# Patient Record
Sex: Male | Born: 1980 | Hispanic: Yes | Marital: Married | Smoking: Never smoker
Health system: Southern US, Community
[De-identification: ages and names within clinical notes are randomized; demographics above are authoritative.]

---

## 2017-03-12 ENCOUNTER — Encounter (HOSPITAL_BASED_OUTPATIENT_CLINIC_OR_DEPARTMENT_OTHER): Payer: Self-pay | Admitting: Emergency Medicine

## 2017-03-12 ENCOUNTER — Emergency Department (HOSPITAL_BASED_OUTPATIENT_CLINIC_OR_DEPARTMENT_OTHER): Payer: Self-pay

## 2017-03-12 ENCOUNTER — Emergency Department (HOSPITAL_BASED_OUTPATIENT_CLINIC_OR_DEPARTMENT_OTHER)
Admission: EM | Admit: 2017-03-12 | Discharge: 2017-03-12 | Disposition: A | Discharge status: Home / Self Care | Payer: Self-pay | Attending: Emergency Medicine | Admitting: Emergency Medicine

## 2017-03-12 DIAGNOSIS — Y929 Unspecified place or not applicable: Secondary | ICD-10-CM | POA: Insufficient documentation

## 2017-03-12 DIAGNOSIS — S93491A Sprain of other ligament of right ankle, initial encounter: Secondary | ICD-10-CM

## 2017-03-12 DIAGNOSIS — Y999 Unspecified external cause status: Secondary | ICD-10-CM | POA: Insufficient documentation

## 2017-03-12 DIAGNOSIS — Y9301 Activity, walking, marching and hiking: Secondary | ICD-10-CM | POA: Insufficient documentation

## 2017-03-12 DIAGNOSIS — S93431A Sprain of tibiofibular ligament of right ankle, initial encounter: Secondary | ICD-10-CM | POA: Insufficient documentation

## 2017-03-12 DIAGNOSIS — W0110XA Fall on same level from slipping, tripping and stumbling with subsequent striking against unspecified object, initial encounter: Secondary | ICD-10-CM | POA: Insufficient documentation

## 2017-03-12 MED ORDER — ACETAMINOPHEN 500 MG PO TABS
1000.0000 mg | ORAL_TABLET | Freq: Once | ORAL | Status: AC
  Administered 2017-03-12: 1000 mg via ORAL
  Filled 2017-03-12: qty 2

## 2017-03-12 NOTE — ED Provider Notes (Signed)
[facility] Provider Note   CSN: [id number] Arrival date & time: 03/12/17  1037     History   Chief Complaint Chief Complaint  Patient presents with  . Ankle Pain    HPI Randy Costa is a 36 y.o. male.  HPI  [age] year old male with no pertinent past medical history presents the ED with constant right ankle pain following mechanical fall 1-2 hours prior to arrival. Patient reports that he slipped on a wet step, twisting his ankle on the stairs. Patient reports being able to catch himself to prevent from additional injury. Denies any other injuries including head, chest, abdomen, back. Pain is exacerbated with palpation and range of motion of the ankle, as well as ambulation. Pain is improved with immobility. No other alleviating or aggravating factors. Patient denies any other physical complaints.   History reviewed. No pertinent past medical history.  There are no active problems to display for this patient.   History reviewed. No pertinent surgical history.     Home Medications    Prior to Admission medications   Not on File    Family History History reviewed. No pertinent family history.  Social History Social History  Substance Use Topics  . Smoking status: Never Smoker  . Smokeless tobacco: Never Used  . Alcohol use No     Allergies   Patient has no known allergies.   Review of Systems Review of Systems  Musculoskeletal: Positive for gait problem and joint swelling.     Physical Exam Updated Vital Signs BP 125/68 (BP Location: Right Arm)   Pulse 72   Resp 18   Ht 5\' 3"  (1.6 m)   SpO2 100%   Physical Exam  Constitutional: He is oriented to person, place, and time. He appears well-developed and well-nourished. No distress.  HENT:  Head: Normocephalic and atraumatic.  Right Ear: External ear normal.  Left Ear: External ear normal.  Nose: Nose normal.  Mouth/Throat: Mucous membranes are normal. No trismus in the jaw.  Eyes:  Conjunctivae and EOM are normal. No scleral icterus.  Neck: Normal range of motion and phonation normal.  Cardiovascular: Normal rate and regular rhythm.   Pulmonary/Chest: Effort normal. No stridor. No respiratory distress.  Abdominal: He exhibits no distension.  Musculoskeletal: Normal range of motion. He exhibits no edema.       Right ankle: He exhibits swelling. He exhibits normal range of motion, no ecchymosis, no deformity and normal pulse. Tenderness. Medial malleolus, AITFL and posterior TFL tenderness found. No head of 5th metatarsal and no proximal fibula tenderness found.  Neurological: He is alert and oriented to person, place, and time.  Skin: He is not diaphoretic.  Psychiatric: He has a normal mood and affect. His behavior is normal.  Vitals reviewed.    ED Treatments / Results  Labs (all labs ordered are listed, but only abnormal results are displayed) Labs Reviewed - No data to display  EKG  EKG Interpretation None       Radiology Dg Ankle Complete Right  Result Date: 03/12/2017 CLINICAL DATA:  Lateral foot and ankle pain after fall. EXAM: RIGHT ANKLE - COMPLETE 3+ VIEW COMPARISON:  None. FINDINGS: No acute fracture or malalignment. The talar dome is intact. The ankle mortise is symmetric. Joint spaces are preserved. Small tibiotalar joint effusion. Mild soft tissue swelling about the ankle. IMPRESSION: Mild soft tissue swelling about the ankle and small tibiotalar joint effusion. No fracture. Electronically Signed   By: Obie DredgeWilliam T Derry M.D.   On:  03/12/2017 11:05   Dg Foot Complete Right  Result Date: 03/12/2017 CLINICAL DATA:  Lateral foot and ankle pain after fall. EXAM: RIGHT FOOT COMPLETE - 3+ VIEW COMPARISON:  None. FINDINGS: There is no evidence of fracture or dislocation. There is no evidence of arthropathy or other focal bone abnormality. Soft tissues are unremarkable. Small tibiotalar joint effusion. IMPRESSION: Small tibiotalar joint effusion.  No  fracture. Electronically Signed   By: Obie DredgeWilliam T Derry M.D.   On: 03/12/2017 11:06    Procedures Procedures (including critical care time)  Medications Ordered in ED Medications  acetaminophen (TYLENOL) tablet 1,000 mg (not administered)     Initial Impression / Assessment and Plan / ED Course  I have reviewed the triage vital signs and the nursing notes.  Pertinent labs & imaging results that were available during my care of the patient were reviewed by me and considered in my medical decision making (see chart for details).     Plain film without acute fractures. Likely low-grade sprain given the lack of instability on exam. Provided with an air cast. Recommended RICE and symptomatic treatment.  The patient is safe for discharge with strict return precautions.   Final Clinical Impressions(s) / ED Diagnoses   Final diagnoses:  Sprain of anterior talofibular ligament of right ankle, initial encounter   Disposition: Discharge  Condition: Good  I have discussed the results, Dx and Tx plan with the patient who expressed understanding and agree(s) with the plan. Discharge instructions discussed at great length. The patient was given strict return precautions who verbalized understanding of the instructions. No further questions at time of discharge.    New Prescriptions   No medications on file    Follow Up: Primary care provider         Nira Connardama, Lowen Mansouri Eduardo, MD 03/12/17 1153

## 2017-03-12 NOTE — ED Notes (Signed)
Patient returned from X-ray 

## 2017-03-12 NOTE — ED Triage Notes (Signed)
Patient fell down some stairs  - patient has pain to his right ankle and foot

## 2018-05-24 IMAGING — CR DG ANKLE COMPLETE 3+V*R*
3 series · 3 of 3 positions shown · non-contrast
Comparison: None.

CLINICAL DATA: Lateral foot and ankle pain after fall.

EXAM:
RIGHT ANKLE - COMPLETE 3+ VIEW

[t ankle joint ap right]
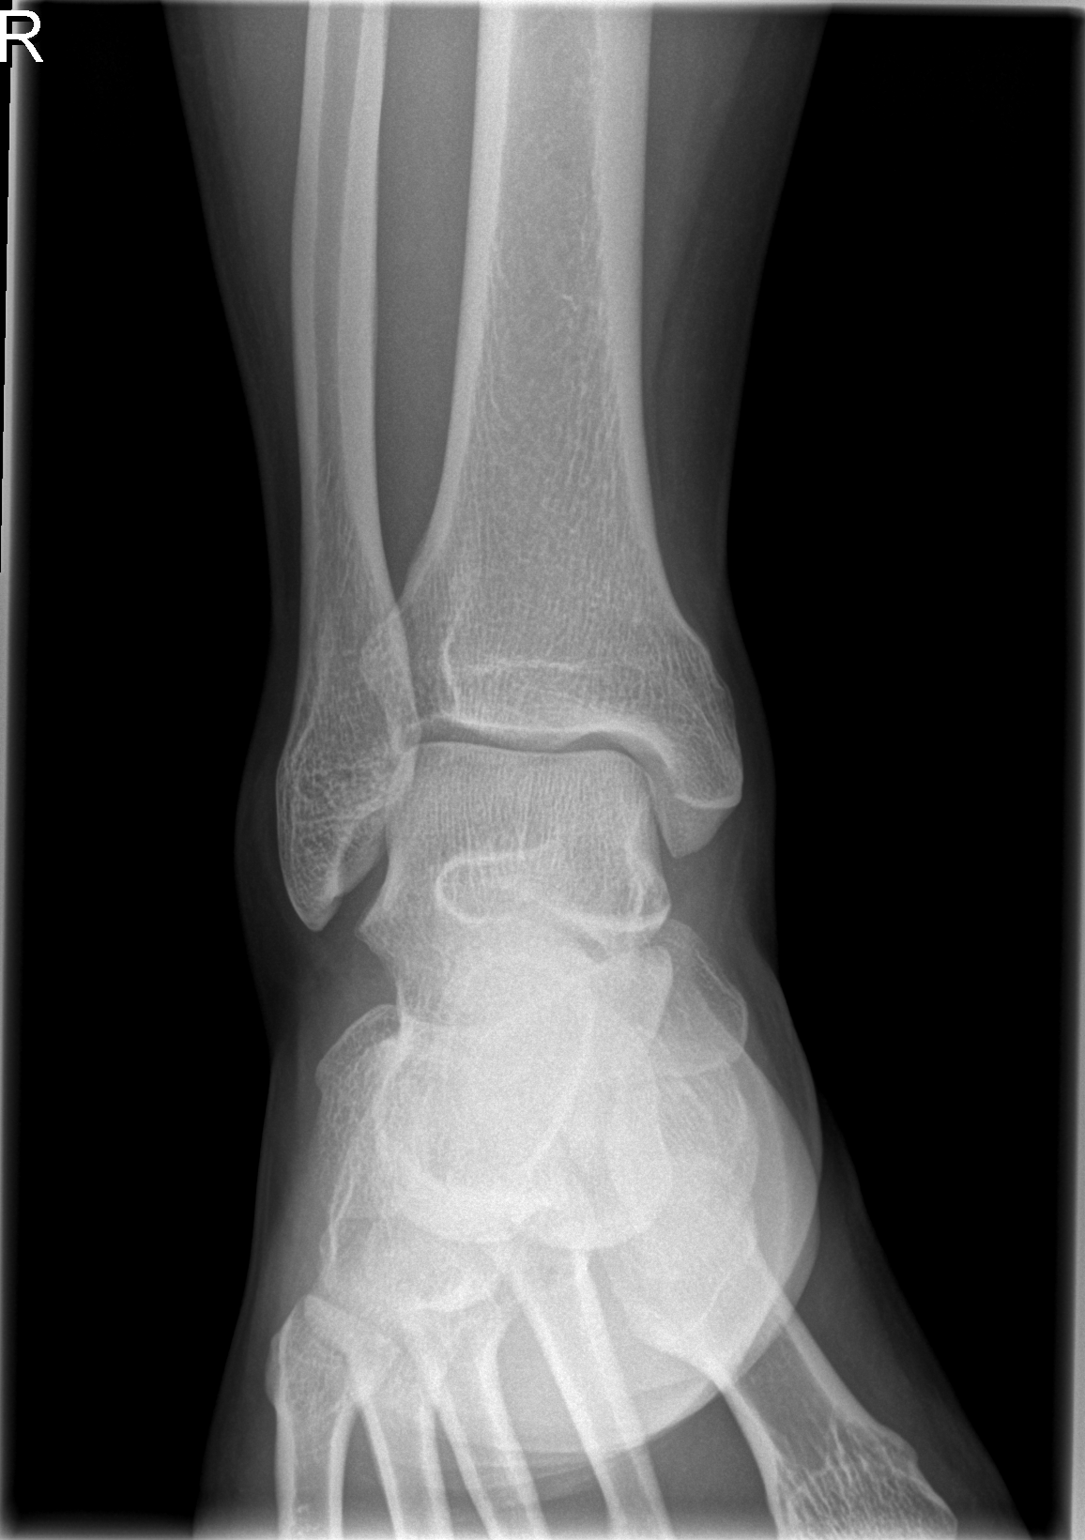

[t ankle joint oblique right]
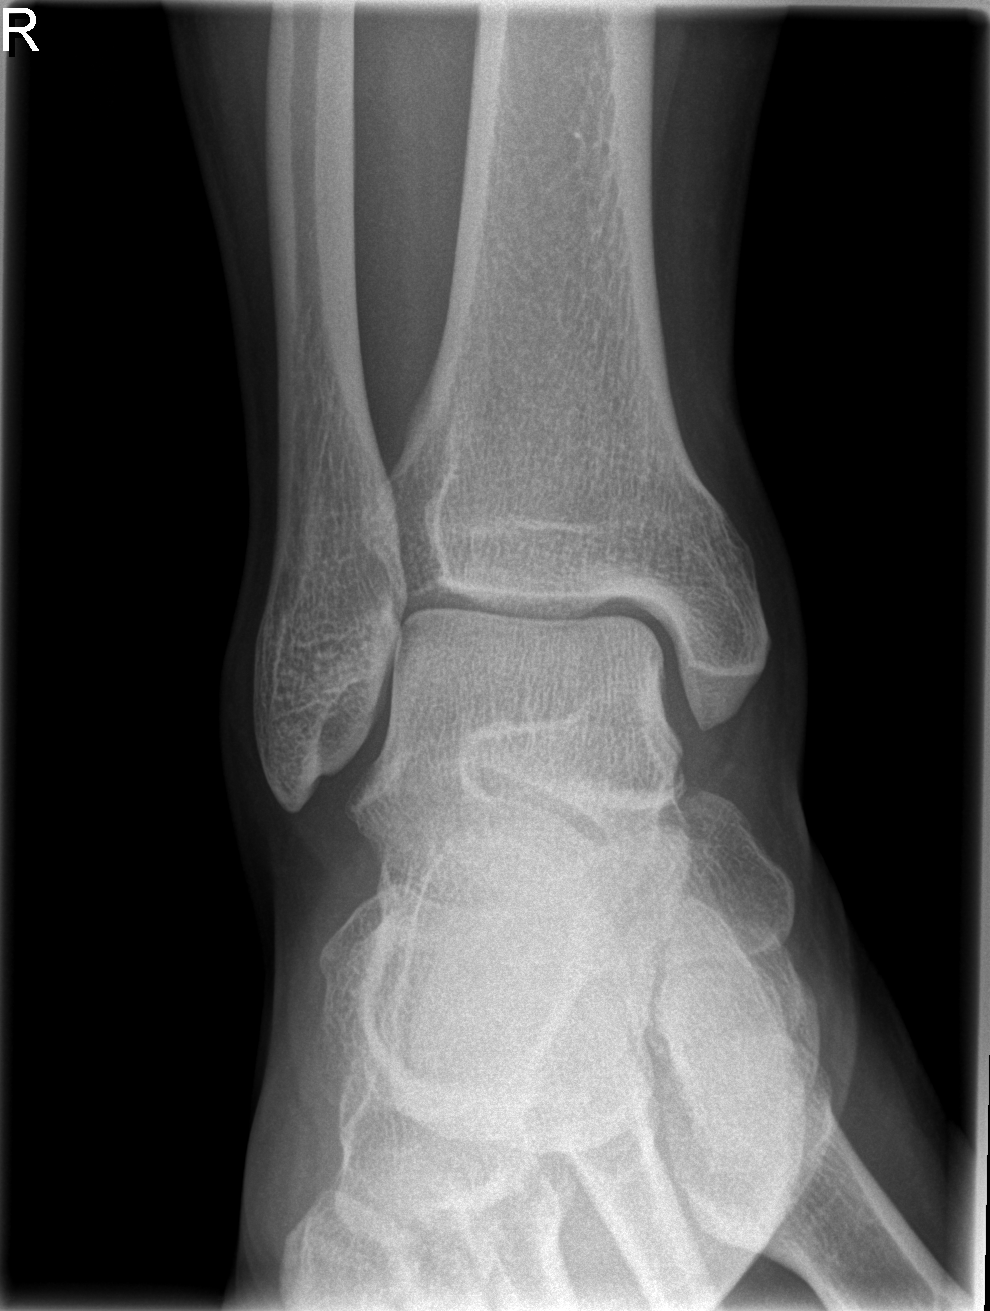

[t ankle joint lat right]
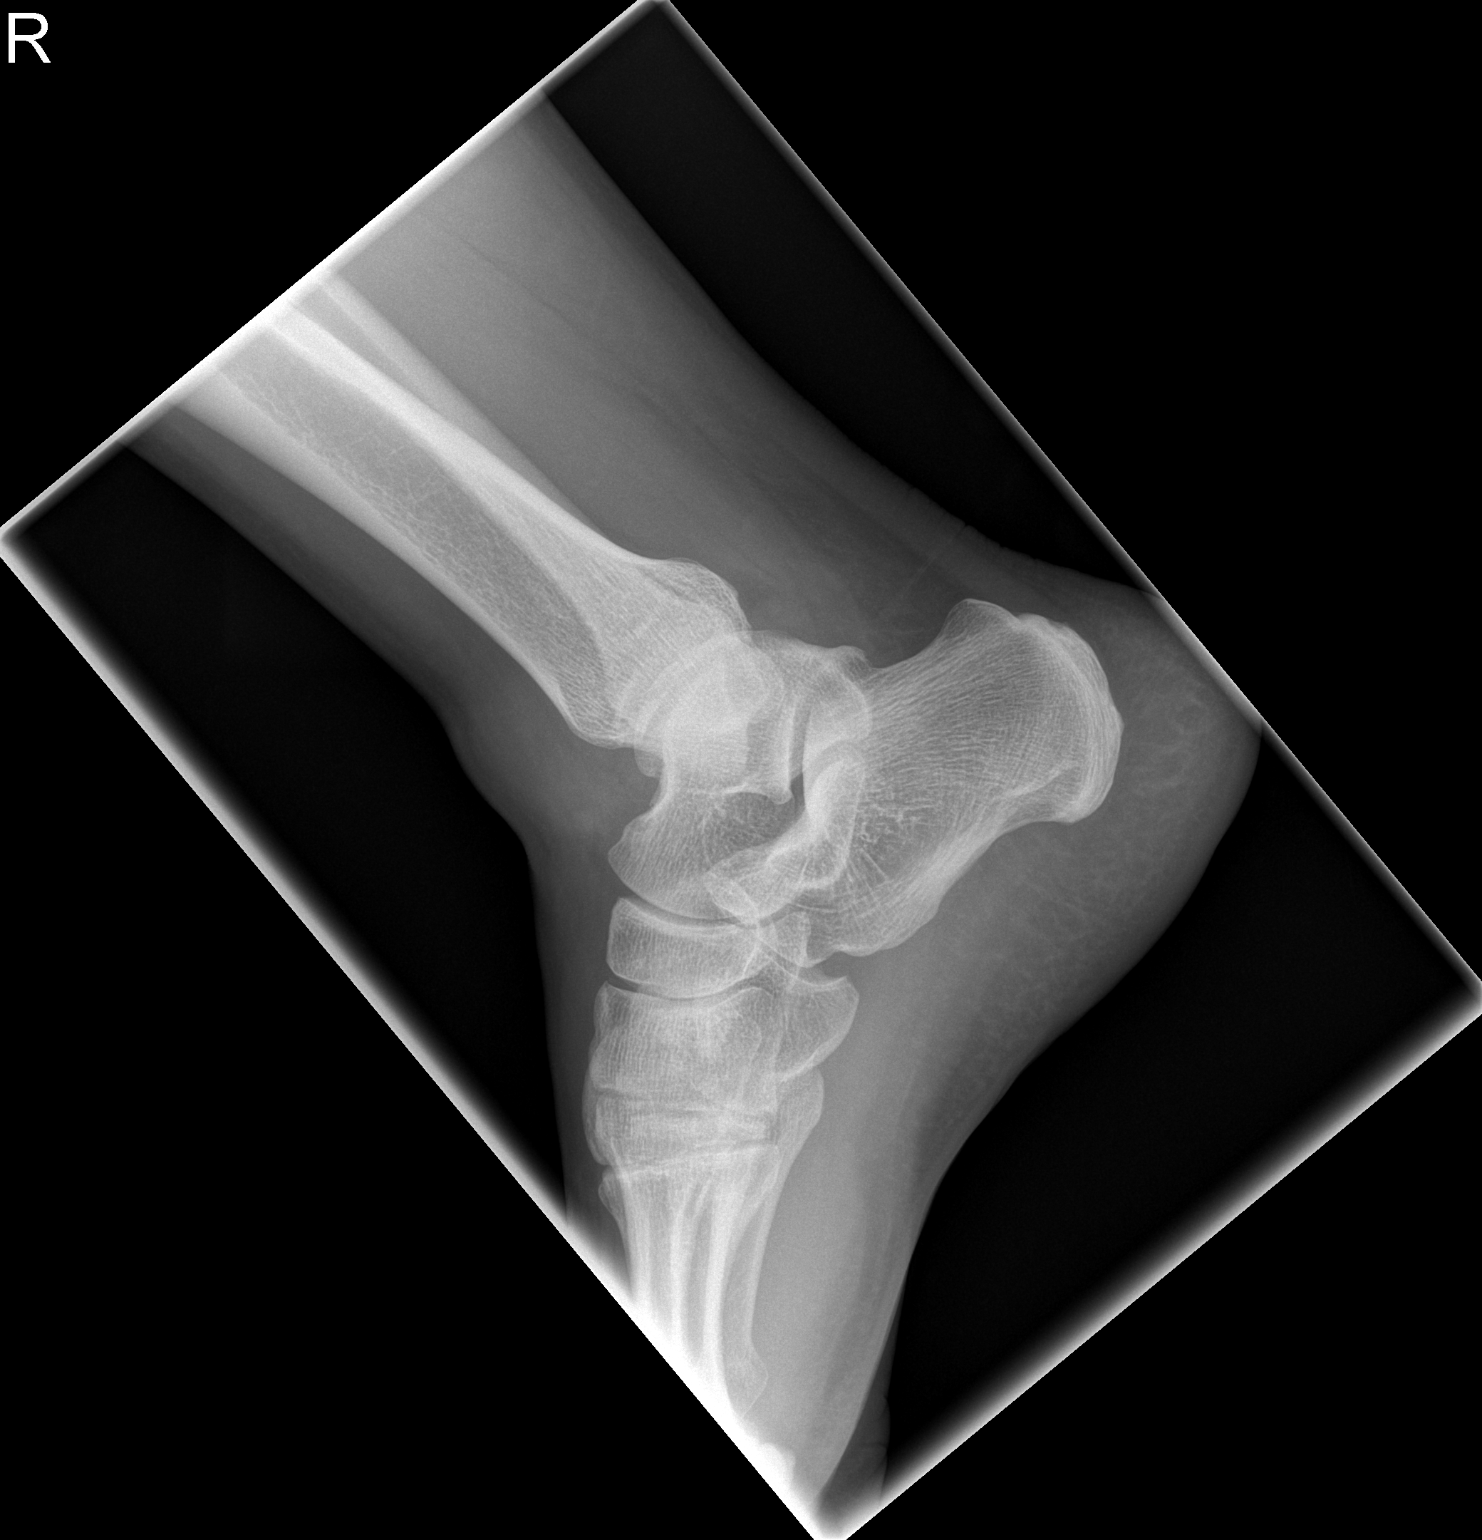

[3 of 3 positions shown; findings below may reference images not displayed]

FINDINGS: No acute fracture or malalignment. The talar dome is intact. The
ankle mortise is symmetric. Joint spaces are preserved. Small
tibiotalar joint effusion. Mild soft tissue swelling about the
ankle.
IMPRESSION: Mild soft tissue swelling about the ankle and small tibiotalar joint
effusion. No fracture.

## 2018-05-24 IMAGING — CR DG FOOT COMPLETE 3+V*R*
3 series · 3 of 3 positions shown · non-contrast
Comparison: None.

CLINICAL DATA: Lateral foot and ankle pain after fall.

EXAM:
RIGHT FOOT COMPLETE - 3+ VIEW

[t foot ap right]
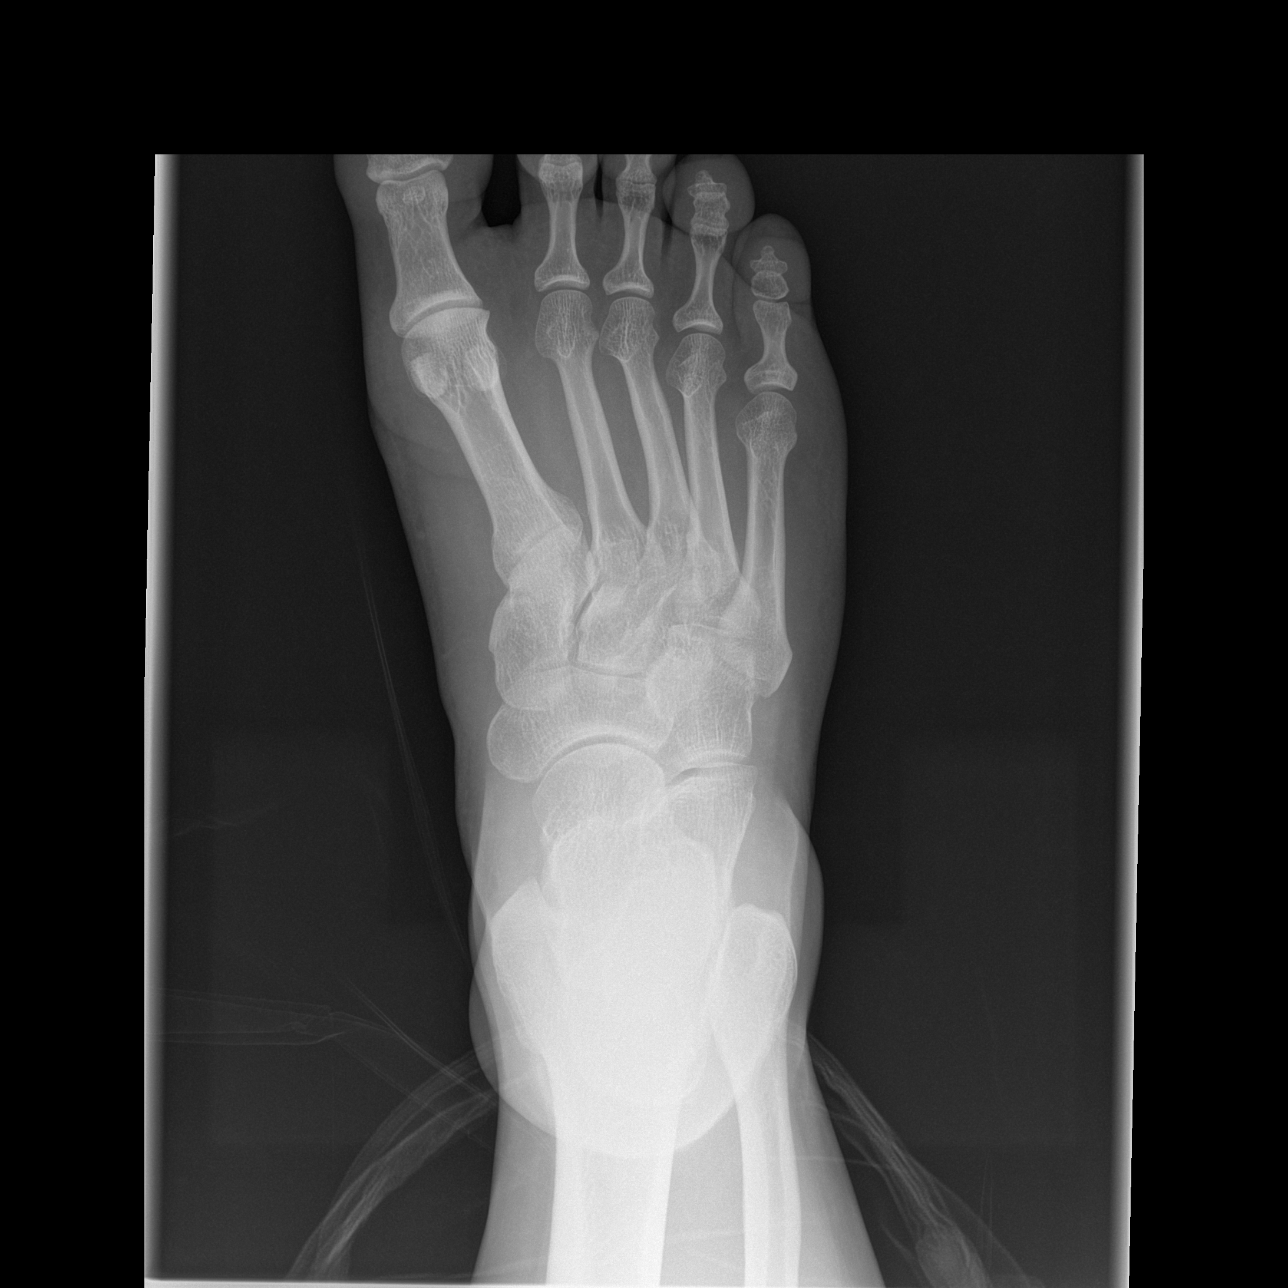

[t foot oblique right]
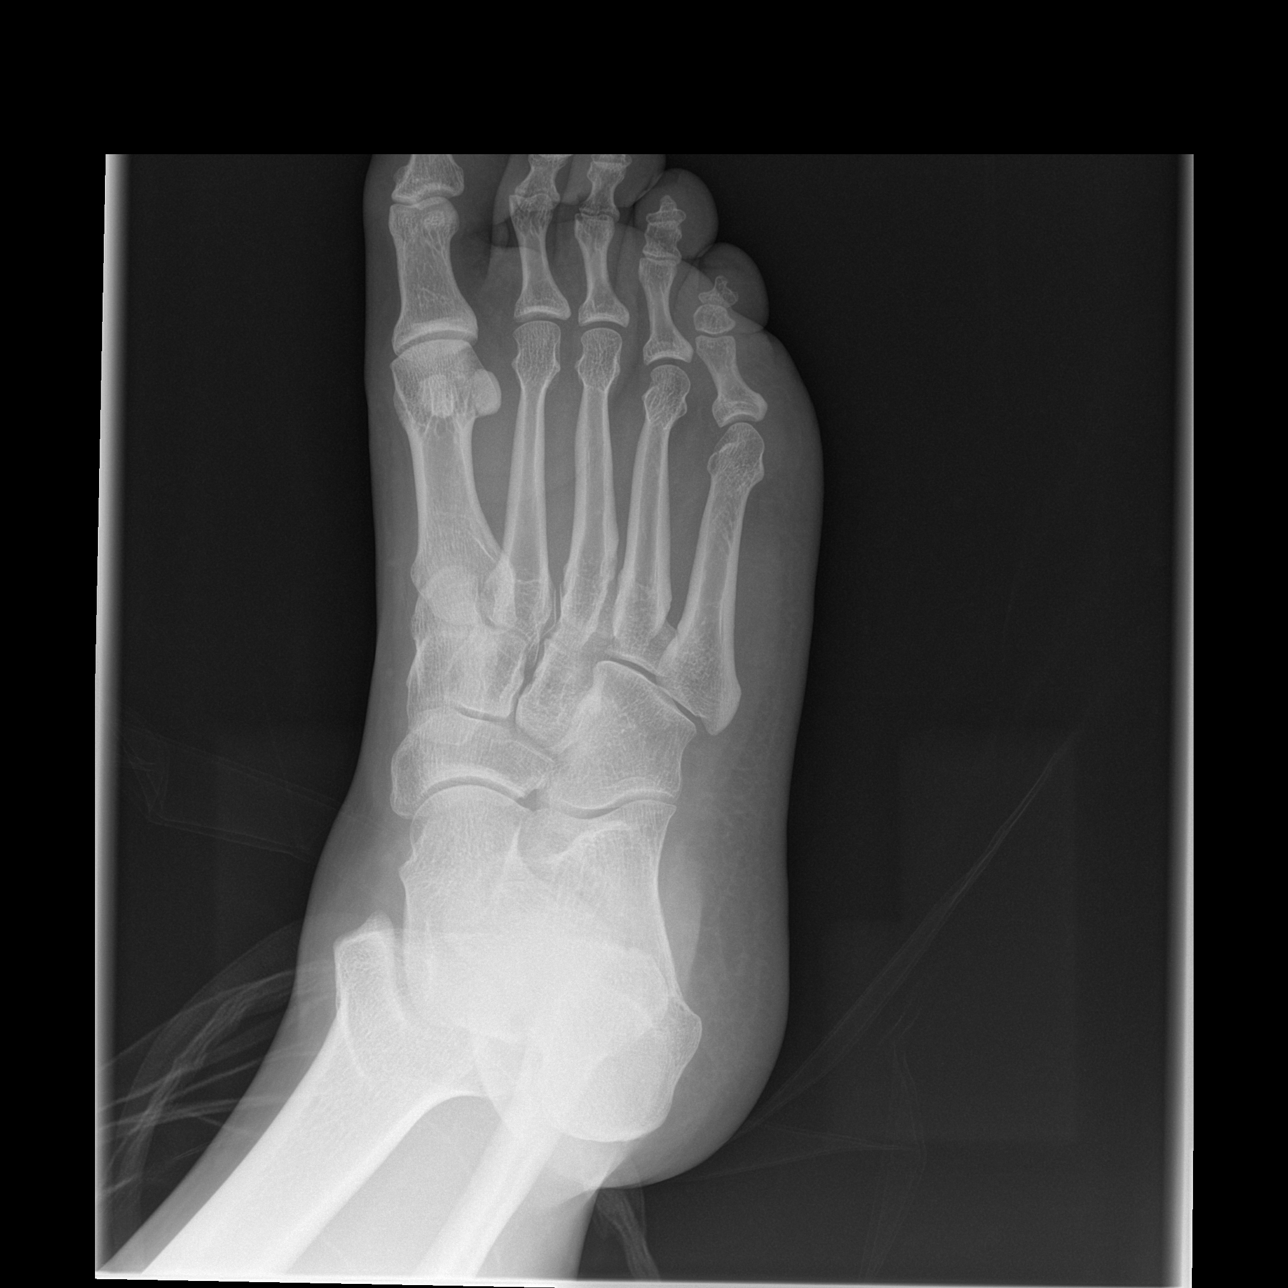

[t foot lat right]
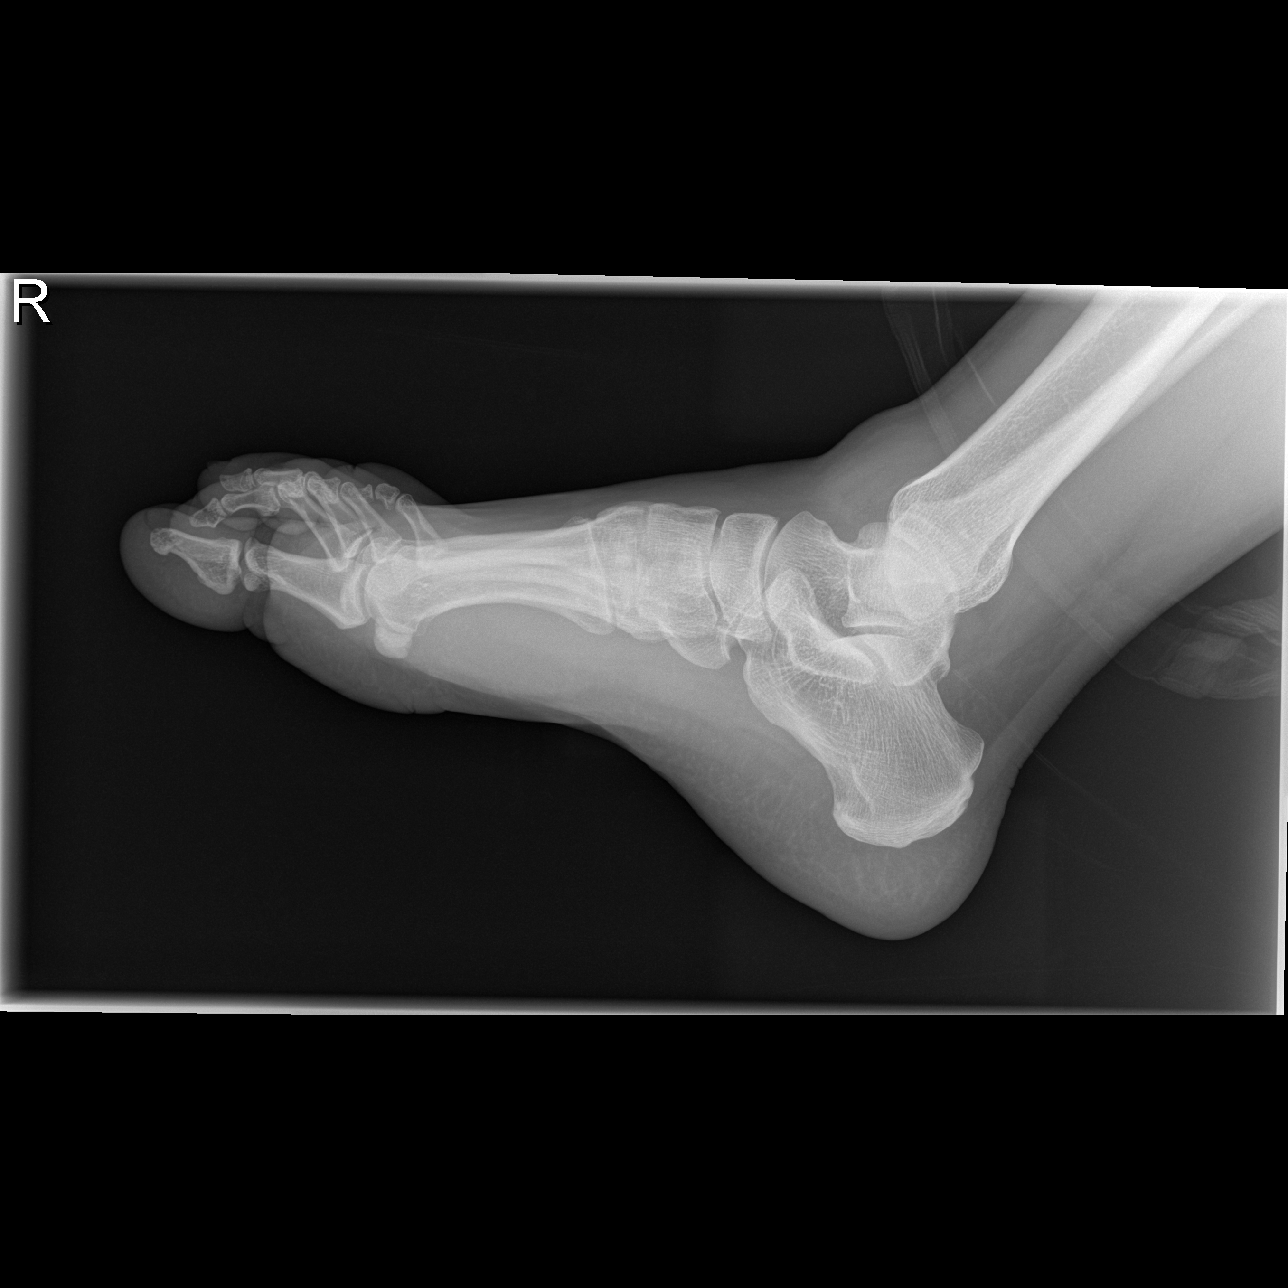

[3 of 3 positions shown; findings below may reference images not displayed]

FINDINGS: There is no evidence of fracture or dislocation. There is no
evidence of arthropathy or other focal bone abnormality. Soft
tissues are unremarkable. Small tibiotalar joint effusion.
IMPRESSION: Small tibiotalar joint effusion.  No fracture.

## 2019-01-22 ENCOUNTER — Telehealth: Payer: Self-pay | Admitting: *Deleted

## 2019-01-22 ENCOUNTER — Other Ambulatory Visit: Payer: Self-pay

## 2019-01-22 DIAGNOSIS — Z20822 Contact with and (suspected) exposure to covid-19: Secondary | ICD-10-CM

## 2019-01-22 NOTE — Telephone Encounter (Signed)
Referred for covid19 due to symptoms and possible exposure. Referred by Lakin Kerri Perches # [phone]. Informed patient to wear mask and stay in vehicle.

## 2019-01-24 LAB — NOVEL CORONAVIRUS, NAA: SARS-CoV-2, NAA: NOT DETECTED

## 2019-11-15 ENCOUNTER — Ambulatory Visit: Payer: Self-pay
